# Patient Record
Sex: Female | Born: 2000 | Race: White | Hispanic: No | Marital: Single | Smoking: Never smoker
Health system: Southern US, Community
[De-identification: ages and names within clinical notes are randomized; demographics above are authoritative.]

## PROBLEM LIST (undated history)

## (undated) DIAGNOSIS — S060XAA Concussion with loss of consciousness status unknown, initial encounter: Secondary | ICD-10-CM

## (undated) DIAGNOSIS — T7840XA Allergy, unspecified, initial encounter: Secondary | ICD-10-CM

## (undated) DIAGNOSIS — S060X9A Concussion with loss of consciousness of unspecified duration, initial encounter: Secondary | ICD-10-CM

## (undated) HISTORY — DX: Concussion with loss of consciousness status unknown, initial encounter: S06.0XAA

## (undated) HISTORY — DX: Allergy, unspecified, initial encounter: T78.40XA

## (undated) HISTORY — DX: Concussion with loss of consciousness of unspecified duration, initial encounter: S06.0X9A

---
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
## 2018-02-19 ENCOUNTER — Encounter: Payer: Self-pay | Admitting: General Practice

## 2018-06-05 DIAGNOSIS — J029 Acute pharyngitis, unspecified: Secondary | ICD-10-CM | POA: Diagnosis not present

## 2018-09-29 DIAGNOSIS — F4323 Adjustment disorder with mixed anxiety and depressed mood: Secondary | ICD-10-CM | POA: Diagnosis not present

## 2018-10-02 DIAGNOSIS — F4323 Adjustment disorder with mixed anxiety and depressed mood: Secondary | ICD-10-CM | POA: Diagnosis not present

## 2018-10-03 DIAGNOSIS — F4323 Adjustment disorder with mixed anxiety and depressed mood: Secondary | ICD-10-CM | POA: Diagnosis not present

## 2018-10-13 DIAGNOSIS — J039 Acute tonsillitis, unspecified: Secondary | ICD-10-CM | POA: Diagnosis not present

## 2018-10-13 DIAGNOSIS — B2790 Infectious mononucleosis, unspecified without complication: Secondary | ICD-10-CM | POA: Diagnosis not present

## 2018-11-03 ENCOUNTER — Ambulatory Visit: Payer: 59 | Admitting: Psychology
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
## 2018-12-23 DIAGNOSIS — Z23 Encounter for immunization: Secondary | ICD-10-CM | POA: Diagnosis not present

## 2018-12-23 DIAGNOSIS — Z0000 Encounter for general adult medical examination without abnormal findings: Secondary | ICD-10-CM | POA: Diagnosis not present

## 2019-02-24 DIAGNOSIS — Z23 Encounter for immunization: Secondary | ICD-10-CM | POA: Diagnosis not present

## 2019-03-02 DIAGNOSIS — M25561 Pain in right knee: Secondary | ICD-10-CM | POA: Diagnosis not present

## 2019-03-06 DIAGNOSIS — M25561 Pain in right knee: Secondary | ICD-10-CM | POA: Diagnosis not present

## 2019-03-16 DIAGNOSIS — Z20828 Contact with and (suspected) exposure to other viral communicable diseases: Secondary | ICD-10-CM | POA: Diagnosis not present

## 2019-03-22 DIAGNOSIS — Z3200 Encounter for pregnancy test, result unknown: Secondary | ICD-10-CM | POA: Diagnosis not present

## 2019-03-22 DIAGNOSIS — N83201 Unspecified ovarian cyst, right side: Secondary | ICD-10-CM | POA: Diagnosis not present

## 2019-03-22 DIAGNOSIS — R10813 Right lower quadrant abdominal tenderness: Secondary | ICD-10-CM | POA: Diagnosis not present

## 2019-03-22 DIAGNOSIS — R1031 Right lower quadrant pain: Secondary | ICD-10-CM | POA: Diagnosis not present

## 2019-03-22 DIAGNOSIS — Z3202 Encounter for pregnancy test, result negative: Secondary | ICD-10-CM | POA: Diagnosis not present

## 2019-03-22 DIAGNOSIS — R300 Dysuria: Secondary | ICD-10-CM | POA: Diagnosis not present

## 2019-03-24 DIAGNOSIS — N83209 Unspecified ovarian cyst, unspecified side: Secondary | ICD-10-CM | POA: Diagnosis not present

## 2019-03-24 DIAGNOSIS — R109 Unspecified abdominal pain: Secondary | ICD-10-CM | POA: Diagnosis not present

## 2019-03-27 DIAGNOSIS — N83209 Unspecified ovarian cyst, unspecified side: Secondary | ICD-10-CM | POA: Diagnosis not present

## 2019-04-06 DIAGNOSIS — R1031 Right lower quadrant pain: Secondary | ICD-10-CM | POA: Diagnosis not present

## 2019-04-06 DIAGNOSIS — Z3202 Encounter for pregnancy test, result negative: Secondary | ICD-10-CM | POA: Diagnosis not present

## 2019-04-06 DIAGNOSIS — N83201 Unspecified ovarian cyst, right side: Secondary | ICD-10-CM | POA: Diagnosis not present

## 2019-04-06 DIAGNOSIS — Z975 Presence of (intrauterine) contraceptive device: Secondary | ICD-10-CM | POA: Diagnosis not present

## 2019-04-09 DIAGNOSIS — R102 Pelvic and perineal pain: Secondary | ICD-10-CM | POA: Diagnosis not present

## 2019-04-17 DIAGNOSIS — Z30017 Encounter for initial prescription of implantable subdermal contraceptive: Secondary | ICD-10-CM | POA: Diagnosis not present

## 2019-04-28 DIAGNOSIS — R109 Unspecified abdominal pain: Secondary | ICD-10-CM | POA: Diagnosis not present

## 2019-05-01 DIAGNOSIS — Z3046 Encounter for surveillance of implantable subdermal contraceptive: Secondary | ICD-10-CM | POA: Diagnosis not present

## 2019-05-01 DIAGNOSIS — Z975 Presence of (intrauterine) contraceptive device: Secondary | ICD-10-CM | POA: Diagnosis not present

## 2019-05-12 DIAGNOSIS — L700 Acne vulgaris: Secondary | ICD-10-CM | POA: Diagnosis not present

## 2019-06-01 DIAGNOSIS — Z23 Encounter for immunization: Secondary | ICD-10-CM | POA: Diagnosis not present

## 2019-06-03 DIAGNOSIS — R109 Unspecified abdominal pain: Secondary | ICD-10-CM | POA: Diagnosis not present

## 2019-06-03 DIAGNOSIS — N898 Other specified noninflammatory disorders of vagina: Secondary | ICD-10-CM | POA: Diagnosis not present

## 2019-06-22 DIAGNOSIS — Z975 Presence of (intrauterine) contraceptive device: Secondary | ICD-10-CM | POA: Diagnosis not present

## 2019-06-22 DIAGNOSIS — Z8742 Personal history of other diseases of the female genital tract: Secondary | ICD-10-CM | POA: Diagnosis not present

## 2019-06-22 DIAGNOSIS — B379 Candidiasis, unspecified: Secondary | ICD-10-CM | POA: Diagnosis not present

## 2019-06-22 DIAGNOSIS — N926 Irregular menstruation, unspecified: Secondary | ICD-10-CM | POA: Diagnosis not present

## 2019-06-22 DIAGNOSIS — N83209 Unspecified ovarian cyst, unspecified side: Secondary | ICD-10-CM | POA: Diagnosis not present

## 2019-07-14 DIAGNOSIS — Z79899 Other long term (current) drug therapy: Secondary | ICD-10-CM | POA: Diagnosis not present

## 2019-07-14 DIAGNOSIS — F329 Major depressive disorder, single episode, unspecified: Secondary | ICD-10-CM | POA: Diagnosis not present

## 2019-07-14 DIAGNOSIS — F411 Generalized anxiety disorder: Secondary | ICD-10-CM | POA: Diagnosis not present

## 2019-07-14 DIAGNOSIS — Z658 Other specified problems related to psychosocial circumstances: Secondary | ICD-10-CM | POA: Diagnosis not present

## 2019-08-10 DIAGNOSIS — U071 COVID-19: Secondary | ICD-10-CM | POA: Diagnosis not present

## 2019-08-21 DIAGNOSIS — F411 Generalized anxiety disorder: Secondary | ICD-10-CM | POA: Diagnosis not present

## 2019-08-21 DIAGNOSIS — Z79899 Other long term (current) drug therapy: Secondary | ICD-10-CM | POA: Diagnosis not present

## 2019-08-21 DIAGNOSIS — U071 COVID-19: Secondary | ICD-10-CM | POA: Diagnosis not present

## 2019-08-21 DIAGNOSIS — R0902 Hypoxemia: Secondary | ICD-10-CM | POA: Diagnosis not present

## 2019-09-01 DIAGNOSIS — R05 Cough: Secondary | ICD-10-CM | POA: Diagnosis not present

## 2019-09-01 DIAGNOSIS — F411 Generalized anxiety disorder: Secondary | ICD-10-CM | POA: Diagnosis not present

## 2019-09-01 DIAGNOSIS — U071 COVID-19: Secondary | ICD-10-CM | POA: Diagnosis not present

## 2019-09-08 DIAGNOSIS — R05 Cough: Secondary | ICD-10-CM | POA: Diagnosis not present

## 2019-09-08 DIAGNOSIS — R0602 Shortness of breath: Secondary | ICD-10-CM | POA: Diagnosis not present

## 2019-09-20 DIAGNOSIS — U071 COVID-19: Secondary | ICD-10-CM | POA: Diagnosis not present

## 2019-11-27 DIAGNOSIS — L700 Acne vulgaris: Secondary | ICD-10-CM | POA: Diagnosis not present

## 2019-12-28 DIAGNOSIS — N926 Irregular menstruation, unspecified: Secondary | ICD-10-CM | POA: Diagnosis not present

## 2019-12-28 DIAGNOSIS — F411 Generalized anxiety disorder: Secondary | ICD-10-CM | POA: Diagnosis not present

## 2019-12-28 DIAGNOSIS — Z975 Presence of (intrauterine) contraceptive device: Secondary | ICD-10-CM | POA: Diagnosis not present

## 2019-12-28 DIAGNOSIS — Z0000 Encounter for general adult medical examination without abnormal findings: Secondary | ICD-10-CM | POA: Diagnosis not present

## 2019-12-30 DIAGNOSIS — Z0000 Encounter for general adult medical examination without abnormal findings: Secondary | ICD-10-CM | POA: Diagnosis not present

## 2020-04-15 DIAGNOSIS — Z30433 Encounter for removal and reinsertion of intrauterine contraceptive device: Secondary | ICD-10-CM | POA: Diagnosis not present

## 2020-04-15 DIAGNOSIS — Z3046 Encounter for surveillance of implantable subdermal contraceptive: Secondary | ICD-10-CM | POA: Diagnosis not present

## 2020-04-15 DIAGNOSIS — Z124 Encounter for screening for malignant neoplasm of cervix: Secondary | ICD-10-CM | POA: Diagnosis not present

## 2020-04-15 DIAGNOSIS — N926 Irregular menstruation, unspecified: Secondary | ICD-10-CM | POA: Diagnosis not present

## 2020-04-15 DIAGNOSIS — Z975 Presence of (intrauterine) contraceptive device: Secondary | ICD-10-CM | POA: Diagnosis not present

## 2020-04-15 DIAGNOSIS — Z30430 Encounter for insertion of intrauterine contraceptive device: Secondary | ICD-10-CM | POA: Diagnosis not present

## 2020-04-19 DIAGNOSIS — N920 Excessive and frequent menstruation with regular cycle: Secondary | ICD-10-CM | POA: Diagnosis not present

## 2020-05-13 DIAGNOSIS — Z30431 Encounter for routine checking of intrauterine contraceptive device: Secondary | ICD-10-CM | POA: Diagnosis not present

## 2020-05-13 DIAGNOSIS — Z975 Presence of (intrauterine) contraceptive device: Secondary | ICD-10-CM | POA: Diagnosis not present

## 2020-05-13 DIAGNOSIS — Z01419 Encounter for gynecological examination (general) (routine) without abnormal findings: Secondary | ICD-10-CM | POA: Diagnosis not present

## 2020-06-08 DIAGNOSIS — Z23 Encounter for immunization: Secondary | ICD-10-CM | POA: Diagnosis not present

## 2020-07-06 DIAGNOSIS — Z23 Encounter for immunization: Secondary | ICD-10-CM | POA: Diagnosis not present

## 2020-08-03 DIAGNOSIS — Z20822 Contact with and (suspected) exposure to covid-19: Secondary | ICD-10-CM | POA: Diagnosis not present

## 2020-08-22 DIAGNOSIS — H04123 Dry eye syndrome of bilateral lacrimal glands: Secondary | ICD-10-CM | POA: Diagnosis not present

## 2020-10-04 DIAGNOSIS — G4700 Insomnia, unspecified: Secondary | ICD-10-CM | POA: Diagnosis not present

## 2020-10-04 DIAGNOSIS — F419 Anxiety disorder, unspecified: Secondary | ICD-10-CM | POA: Diagnosis not present

## 2020-10-04 DIAGNOSIS — J3081 Allergic rhinitis due to animal (cat) (dog) hair and dander: Secondary | ICD-10-CM | POA: Diagnosis not present

## 2020-10-04 DIAGNOSIS — F439 Reaction to severe stress, unspecified: Secondary | ICD-10-CM | POA: Diagnosis not present

## 2020-10-04 DIAGNOSIS — R062 Wheezing: Secondary | ICD-10-CM | POA: Diagnosis not present

## 2020-11-09 DIAGNOSIS — R1030 Lower abdominal pain, unspecified: Secondary | ICD-10-CM | POA: Diagnosis not present

## 2020-11-09 DIAGNOSIS — N888 Other specified noninflammatory disorders of cervix uteri: Secondary | ICD-10-CM | POA: Diagnosis not present

## 2020-11-09 DIAGNOSIS — R109 Unspecified abdominal pain: Secondary | ICD-10-CM | POA: Diagnosis not present

## 2020-11-09 DIAGNOSIS — R300 Dysuria: Secondary | ICD-10-CM | POA: Diagnosis not present

## 2020-11-10 DIAGNOSIS — N888 Other specified noninflammatory disorders of cervix uteri: Secondary | ICD-10-CM | POA: Diagnosis not present

## 2020-11-18 DIAGNOSIS — H00019 Hordeolum externum unspecified eye, unspecified eyelid: Secondary | ICD-10-CM | POA: Diagnosis not present

## 2020-11-18 DIAGNOSIS — F411 Generalized anxiety disorder: Secondary | ICD-10-CM | POA: Diagnosis not present

## 2020-12-13 DIAGNOSIS — D492 Neoplasm of unspecified behavior of bone, soft tissue, and skin: Secondary | ICD-10-CM | POA: Diagnosis not present

## 2020-12-22 DIAGNOSIS — Z23 Encounter for immunization: Secondary | ICD-10-CM | POA: Diagnosis not present

## 2020-12-29 DIAGNOSIS — F411 Generalized anxiety disorder: Secondary | ICD-10-CM | POA: Diagnosis not present

## 2020-12-29 DIAGNOSIS — Z0000 Encounter for general adult medical examination without abnormal findings: Secondary | ICD-10-CM | POA: Diagnosis not present

## 2020-12-29 DIAGNOSIS — Z975 Presence of (intrauterine) contraceptive device: Secondary | ICD-10-CM | POA: Diagnosis not present

## 2020-12-29 DIAGNOSIS — R5383 Other fatigue: Secondary | ICD-10-CM | POA: Diagnosis not present

## 2020-12-30 DIAGNOSIS — Z0000 Encounter for general adult medical examination without abnormal findings: Secondary | ICD-10-CM | POA: Diagnosis not present

## 2020-12-30 DIAGNOSIS — R5383 Other fatigue: Secondary | ICD-10-CM | POA: Diagnosis not present

## 2021-03-01 DIAGNOSIS — U071 COVID-19: Secondary | ICD-10-CM | POA: Diagnosis not present

## 2021-04-13 DIAGNOSIS — Z5181 Encounter for therapeutic drug level monitoring: Secondary | ICD-10-CM | POA: Diagnosis not present

## 2021-04-13 DIAGNOSIS — L700 Acne vulgaris: Secondary | ICD-10-CM | POA: Diagnosis not present

## 2021-04-14 DIAGNOSIS — Z5181 Encounter for therapeutic drug level monitoring: Secondary | ICD-10-CM | POA: Diagnosis not present

## 2021-04-14 DIAGNOSIS — Z79899 Other long term (current) drug therapy: Secondary | ICD-10-CM | POA: Diagnosis not present

## 2021-04-27 DIAGNOSIS — Z832 Family history of diseases of the blood and blood-forming organs and certain disorders involving the immune mechanism: Secondary | ICD-10-CM | POA: Diagnosis not present

## 2021-05-08 DIAGNOSIS — S46911A Strain of unspecified muscle, fascia and tendon at shoulder and upper arm level, right arm, initial encounter: Secondary | ICD-10-CM | POA: Diagnosis not present

## 2021-05-11 DIAGNOSIS — S46911A Strain of unspecified muscle, fascia and tendon at shoulder and upper arm level, right arm, initial encounter: Secondary | ICD-10-CM | POA: Diagnosis not present

## 2021-05-12 DIAGNOSIS — M25511 Pain in right shoulder: Secondary | ICD-10-CM | POA: Diagnosis not present

## 2021-05-16 DIAGNOSIS — M9902 Segmental and somatic dysfunction of thoracic region: Secondary | ICD-10-CM | POA: Diagnosis not present

## 2021-05-16 DIAGNOSIS — M9901 Segmental and somatic dysfunction of cervical region: Secondary | ICD-10-CM | POA: Diagnosis not present

## 2021-05-16 DIAGNOSIS — M25511 Pain in right shoulder: Secondary | ICD-10-CM | POA: Diagnosis not present

## 2021-05-17 DIAGNOSIS — Z5181 Encounter for therapeutic drug level monitoring: Secondary | ICD-10-CM | POA: Diagnosis not present

## 2021-05-17 DIAGNOSIS — L700 Acne vulgaris: Secondary | ICD-10-CM | POA: Diagnosis not present

## 2021-05-23 DIAGNOSIS — M9902 Segmental and somatic dysfunction of thoracic region: Secondary | ICD-10-CM | POA: Diagnosis not present

## 2021-05-23 DIAGNOSIS — M25511 Pain in right shoulder: Secondary | ICD-10-CM | POA: Diagnosis not present

## 2021-05-23 DIAGNOSIS — M9901 Segmental and somatic dysfunction of cervical region: Secondary | ICD-10-CM | POA: Diagnosis not present

## 2021-05-30 DIAGNOSIS — M9901 Segmental and somatic dysfunction of cervical region: Secondary | ICD-10-CM | POA: Diagnosis not present

## 2021-05-30 DIAGNOSIS — M9902 Segmental and somatic dysfunction of thoracic region: Secondary | ICD-10-CM | POA: Diagnosis not present

## 2021-05-30 DIAGNOSIS — M25511 Pain in right shoulder: Secondary | ICD-10-CM | POA: Diagnosis not present

## 2021-06-06 DIAGNOSIS — M9901 Segmental and somatic dysfunction of cervical region: Secondary | ICD-10-CM | POA: Diagnosis not present

## 2021-06-06 DIAGNOSIS — M25511 Pain in right shoulder: Secondary | ICD-10-CM | POA: Diagnosis not present

## 2021-06-06 DIAGNOSIS — M9902 Segmental and somatic dysfunction of thoracic region: Secondary | ICD-10-CM | POA: Diagnosis not present

## 2021-06-07 DIAGNOSIS — N898 Other specified noninflammatory disorders of vagina: Secondary | ICD-10-CM | POA: Diagnosis not present

## 2021-06-19 DIAGNOSIS — R040 Epistaxis: Secondary | ICD-10-CM | POA: Diagnosis not present

## 2021-06-19 DIAGNOSIS — L700 Acne vulgaris: Secondary | ICD-10-CM | POA: Diagnosis not present

## 2021-06-19 DIAGNOSIS — M9901 Segmental and somatic dysfunction of cervical region: Secondary | ICD-10-CM | POA: Diagnosis not present

## 2021-06-19 DIAGNOSIS — M25511 Pain in right shoulder: Secondary | ICD-10-CM | POA: Diagnosis not present

## 2021-06-19 DIAGNOSIS — M9902 Segmental and somatic dysfunction of thoracic region: Secondary | ICD-10-CM | POA: Diagnosis not present

## 2021-06-19 DIAGNOSIS — K130 Diseases of lips: Secondary | ICD-10-CM | POA: Diagnosis not present

## 2021-06-30 DIAGNOSIS — M25511 Pain in right shoulder: Secondary | ICD-10-CM | POA: Diagnosis not present

## 2021-06-30 DIAGNOSIS — M9901 Segmental and somatic dysfunction of cervical region: Secondary | ICD-10-CM | POA: Diagnosis not present

## 2021-06-30 DIAGNOSIS — M9902 Segmental and somatic dysfunction of thoracic region: Secondary | ICD-10-CM | POA: Diagnosis not present

## 2021-07-10 DIAGNOSIS — S46911A Strain of unspecified muscle, fascia and tendon at shoulder and upper arm level, right arm, initial encounter: Secondary | ICD-10-CM | POA: Diagnosis not present

## 2021-07-10 DIAGNOSIS — J029 Acute pharyngitis, unspecified: Secondary | ICD-10-CM | POA: Diagnosis not present

## 2021-07-17 DIAGNOSIS — L853 Xerosis cutis: Secondary | ICD-10-CM | POA: Diagnosis not present

## 2021-07-17 DIAGNOSIS — L700 Acne vulgaris: Secondary | ICD-10-CM | POA: Diagnosis not present

## 2021-07-17 DIAGNOSIS — K130 Diseases of lips: Secondary | ICD-10-CM | POA: Diagnosis not present

## 2021-07-17 DIAGNOSIS — Z5181 Encounter for therapeutic drug level monitoring: Secondary | ICD-10-CM | POA: Diagnosis not present

## 2021-07-21 DIAGNOSIS — H16223 Keratoconjunctivitis sicca, not specified as Sjogren's, bilateral: Secondary | ICD-10-CM | POA: Diagnosis not present

## 2021-07-24 DIAGNOSIS — M25561 Pain in right knee: Secondary | ICD-10-CM | POA: Diagnosis not present

## 2021-07-24 DIAGNOSIS — M25552 Pain in left hip: Secondary | ICD-10-CM | POA: Diagnosis not present

## 2021-08-01 DIAGNOSIS — M9901 Segmental and somatic dysfunction of cervical region: Secondary | ICD-10-CM | POA: Diagnosis not present

## 2021-08-01 DIAGNOSIS — M25561 Pain in right knee: Secondary | ICD-10-CM | POA: Diagnosis not present

## 2021-08-01 DIAGNOSIS — M9902 Segmental and somatic dysfunction of thoracic region: Secondary | ICD-10-CM | POA: Diagnosis not present

## 2021-08-07 DIAGNOSIS — M25552 Pain in left hip: Secondary | ICD-10-CM | POA: Diagnosis not present

## 2021-08-07 DIAGNOSIS — M25561 Pain in right knee: Secondary | ICD-10-CM | POA: Diagnosis not present

## 2021-08-14 DIAGNOSIS — K130 Diseases of lips: Secondary | ICD-10-CM | POA: Diagnosis not present

## 2021-08-14 DIAGNOSIS — L853 Xerosis cutis: Secondary | ICD-10-CM | POA: Diagnosis not present

## 2021-08-14 DIAGNOSIS — L700 Acne vulgaris: Secondary | ICD-10-CM | POA: Diagnosis not present

## 2021-08-14 DIAGNOSIS — Z5181 Encounter for therapeutic drug level monitoring: Secondary | ICD-10-CM | POA: Diagnosis not present

## 2021-08-16 DIAGNOSIS — M25561 Pain in right knee: Secondary | ICD-10-CM | POA: Diagnosis not present

## 2021-08-16 DIAGNOSIS — M9902 Segmental and somatic dysfunction of thoracic region: Secondary | ICD-10-CM | POA: Diagnosis not present

## 2021-08-16 DIAGNOSIS — M9901 Segmental and somatic dysfunction of cervical region: Secondary | ICD-10-CM | POA: Diagnosis not present

## 2021-08-22 DIAGNOSIS — M9902 Segmental and somatic dysfunction of thoracic region: Secondary | ICD-10-CM | POA: Diagnosis not present

## 2021-08-22 DIAGNOSIS — M9903 Segmental and somatic dysfunction of lumbar region: Secondary | ICD-10-CM | POA: Diagnosis not present

## 2021-08-22 DIAGNOSIS — M9904 Segmental and somatic dysfunction of sacral region: Secondary | ICD-10-CM | POA: Diagnosis not present

## 2021-08-22 DIAGNOSIS — M5451 Vertebrogenic low back pain: Secondary | ICD-10-CM | POA: Diagnosis not present

## 2021-08-29 DIAGNOSIS — M9901 Segmental and somatic dysfunction of cervical region: Secondary | ICD-10-CM | POA: Diagnosis not present

## 2021-08-29 DIAGNOSIS — M25561 Pain in right knee: Secondary | ICD-10-CM | POA: Diagnosis not present

## 2021-08-29 DIAGNOSIS — M9902 Segmental and somatic dysfunction of thoracic region: Secondary | ICD-10-CM | POA: Diagnosis not present

## 2021-08-31 DIAGNOSIS — Z975 Presence of (intrauterine) contraceptive device: Secondary | ICD-10-CM | POA: Diagnosis not present

## 2021-08-31 DIAGNOSIS — Z01419 Encounter for gynecological examination (general) (routine) without abnormal findings: Secondary | ICD-10-CM | POA: Diagnosis not present

## 2021-09-05 DIAGNOSIS — M5451 Vertebrogenic low back pain: Secondary | ICD-10-CM | POA: Diagnosis not present

## 2021-09-05 DIAGNOSIS — M9902 Segmental and somatic dysfunction of thoracic region: Secondary | ICD-10-CM | POA: Diagnosis not present

## 2021-09-05 DIAGNOSIS — M9904 Segmental and somatic dysfunction of sacral region: Secondary | ICD-10-CM | POA: Diagnosis not present

## 2021-09-05 DIAGNOSIS — M9903 Segmental and somatic dysfunction of lumbar region: Secondary | ICD-10-CM | POA: Diagnosis not present

## 2021-09-08 DIAGNOSIS — F410 Panic disorder [episodic paroxysmal anxiety] without agoraphobia: Secondary | ICD-10-CM | POA: Diagnosis not present

## 2021-09-08 DIAGNOSIS — F409 Phobic anxiety disorder, unspecified: Secondary | ICD-10-CM | POA: Diagnosis not present

## 2021-09-11 DIAGNOSIS — L700 Acne vulgaris: Secondary | ICD-10-CM | POA: Diagnosis not present

## 2021-09-11 DIAGNOSIS — Z5181 Encounter for therapeutic drug level monitoring: Secondary | ICD-10-CM | POA: Diagnosis not present

## 2021-09-11 DIAGNOSIS — M791 Myalgia, unspecified site: Secondary | ICD-10-CM | POA: Diagnosis not present

## 2021-09-11 DIAGNOSIS — Z79899 Other long term (current) drug therapy: Secondary | ICD-10-CM | POA: Diagnosis not present

## 2021-09-11 DIAGNOSIS — L853 Xerosis cutis: Secondary | ICD-10-CM | POA: Diagnosis not present

## 2021-09-18 DIAGNOSIS — N898 Other specified noninflammatory disorders of vagina: Secondary | ICD-10-CM | POA: Diagnosis not present

## 2021-09-18 DIAGNOSIS — N949 Unspecified condition associated with female genital organs and menstrual cycle: Secondary | ICD-10-CM | POA: Diagnosis not present

## 2021-09-18 DIAGNOSIS — M9902 Segmental and somatic dysfunction of thoracic region: Secondary | ICD-10-CM | POA: Diagnosis not present

## 2021-09-18 DIAGNOSIS — M25561 Pain in right knee: Secondary | ICD-10-CM | POA: Diagnosis not present

## 2021-09-18 DIAGNOSIS — M9901 Segmental and somatic dysfunction of cervical region: Secondary | ICD-10-CM | POA: Diagnosis not present

## 2021-10-09 DIAGNOSIS — M5451 Vertebrogenic low back pain: Secondary | ICD-10-CM | POA: Diagnosis not present

## 2021-10-09 DIAGNOSIS — M9904 Segmental and somatic dysfunction of sacral region: Secondary | ICD-10-CM | POA: Diagnosis not present

## 2021-10-09 DIAGNOSIS — M9903 Segmental and somatic dysfunction of lumbar region: Secondary | ICD-10-CM | POA: Diagnosis not present

## 2021-10-09 DIAGNOSIS — M9902 Segmental and somatic dysfunction of thoracic region: Secondary | ICD-10-CM | POA: Diagnosis not present

## 2021-10-16 DIAGNOSIS — M9901 Segmental and somatic dysfunction of cervical region: Secondary | ICD-10-CM | POA: Diagnosis not present

## 2021-10-16 DIAGNOSIS — M25561 Pain in right knee: Secondary | ICD-10-CM | POA: Diagnosis not present

## 2021-10-16 DIAGNOSIS — M9902 Segmental and somatic dysfunction of thoracic region: Secondary | ICD-10-CM | POA: Diagnosis not present

## 2021-11-08 DIAGNOSIS — S99921A Unspecified injury of right foot, initial encounter: Secondary | ICD-10-CM | POA: Diagnosis not present

## 2021-11-13 DIAGNOSIS — M9903 Segmental and somatic dysfunction of lumbar region: Secondary | ICD-10-CM | POA: Diagnosis not present

## 2021-11-13 DIAGNOSIS — M5451 Vertebrogenic low back pain: Secondary | ICD-10-CM | POA: Diagnosis not present

## 2021-11-13 DIAGNOSIS — M9904 Segmental and somatic dysfunction of sacral region: Secondary | ICD-10-CM | POA: Diagnosis not present

## 2021-11-13 DIAGNOSIS — M9902 Segmental and somatic dysfunction of thoracic region: Secondary | ICD-10-CM | POA: Diagnosis not present

## 2021-11-15 DIAGNOSIS — L700 Acne vulgaris: Secondary | ICD-10-CM | POA: Diagnosis not present

## 2021-11-15 DIAGNOSIS — Z5181 Encounter for therapeutic drug level monitoring: Secondary | ICD-10-CM | POA: Diagnosis not present

## 2021-12-14 DIAGNOSIS — L700 Acne vulgaris: Secondary | ICD-10-CM | POA: Diagnosis not present

## 2021-12-14 DIAGNOSIS — Z5181 Encounter for therapeutic drug level monitoring: Secondary | ICD-10-CM | POA: Diagnosis not present

## 2021-12-18 DIAGNOSIS — M5451 Vertebrogenic low back pain: Secondary | ICD-10-CM | POA: Diagnosis not present

## 2021-12-18 DIAGNOSIS — M9902 Segmental and somatic dysfunction of thoracic region: Secondary | ICD-10-CM | POA: Diagnosis not present

## 2021-12-18 DIAGNOSIS — M9903 Segmental and somatic dysfunction of lumbar region: Secondary | ICD-10-CM | POA: Diagnosis not present

## 2021-12-18 DIAGNOSIS — M9904 Segmental and somatic dysfunction of sacral region: Secondary | ICD-10-CM | POA: Diagnosis not present

## 2022-01-08 DIAGNOSIS — M9904 Segmental and somatic dysfunction of sacral region: Secondary | ICD-10-CM | POA: Diagnosis not present

## 2022-01-08 DIAGNOSIS — M5451 Vertebrogenic low back pain: Secondary | ICD-10-CM | POA: Diagnosis not present

## 2022-01-08 DIAGNOSIS — M9903 Segmental and somatic dysfunction of lumbar region: Secondary | ICD-10-CM | POA: Diagnosis not present

## 2022-01-08 DIAGNOSIS — M9902 Segmental and somatic dysfunction of thoracic region: Secondary | ICD-10-CM | POA: Diagnosis not present

## 2022-01-29 DIAGNOSIS — M5451 Vertebrogenic low back pain: Secondary | ICD-10-CM | POA: Diagnosis not present

## 2022-01-29 DIAGNOSIS — M9902 Segmental and somatic dysfunction of thoracic region: Secondary | ICD-10-CM | POA: Diagnosis not present

## 2022-01-29 DIAGNOSIS — M9903 Segmental and somatic dysfunction of lumbar region: Secondary | ICD-10-CM | POA: Diagnosis not present

## 2022-01-29 DIAGNOSIS — M9904 Segmental and somatic dysfunction of sacral region: Secondary | ICD-10-CM | POA: Diagnosis not present

## 2022-02-06 DIAGNOSIS — F411 Generalized anxiety disorder: Secondary | ICD-10-CM | POA: Diagnosis not present

## 2022-02-06 DIAGNOSIS — Z0000 Encounter for general adult medical examination without abnormal findings: Secondary | ICD-10-CM | POA: Diagnosis not present

## 2022-02-06 DIAGNOSIS — E663 Overweight: Secondary | ICD-10-CM | POA: Diagnosis not present

## 2022-03-05 DIAGNOSIS — M9903 Segmental and somatic dysfunction of lumbar region: Secondary | ICD-10-CM | POA: Diagnosis not present

## 2022-03-05 DIAGNOSIS — M9904 Segmental and somatic dysfunction of sacral region: Secondary | ICD-10-CM | POA: Diagnosis not present

## 2022-03-05 DIAGNOSIS — M5451 Vertebrogenic low back pain: Secondary | ICD-10-CM | POA: Diagnosis not present

## 2022-03-05 DIAGNOSIS — M9902 Segmental and somatic dysfunction of thoracic region: Secondary | ICD-10-CM | POA: Diagnosis not present

## 2022-04-09 DIAGNOSIS — M9904 Segmental and somatic dysfunction of sacral region: Secondary | ICD-10-CM | POA: Diagnosis not present

## 2022-04-09 DIAGNOSIS — M9903 Segmental and somatic dysfunction of lumbar region: Secondary | ICD-10-CM | POA: Diagnosis not present

## 2022-04-09 DIAGNOSIS — M5451 Vertebrogenic low back pain: Secondary | ICD-10-CM | POA: Diagnosis not present

## 2022-04-09 DIAGNOSIS — M9902 Segmental and somatic dysfunction of thoracic region: Secondary | ICD-10-CM | POA: Diagnosis not present

## 2022-05-15 DIAGNOSIS — M25552 Pain in left hip: Secondary | ICD-10-CM | POA: Diagnosis not present

## 2022-05-15 DIAGNOSIS — M545 Low back pain, unspecified: Secondary | ICD-10-CM | POA: Diagnosis not present

## 2022-05-24 DIAGNOSIS — M545 Low back pain, unspecified: Secondary | ICD-10-CM | POA: Diagnosis not present

## 2022-05-24 DIAGNOSIS — M25552 Pain in left hip: Secondary | ICD-10-CM | POA: Diagnosis not present

## 2022-06-07 DIAGNOSIS — R059 Cough, unspecified: Secondary | ICD-10-CM | POA: Diagnosis not present

## 2022-06-07 DIAGNOSIS — R509 Fever, unspecified: Secondary | ICD-10-CM | POA: Diagnosis not present

## 2022-06-07 DIAGNOSIS — R079 Chest pain, unspecified: Secondary | ICD-10-CM | POA: Diagnosis not present

## 2022-06-08 DIAGNOSIS — R1032 Left lower quadrant pain: Secondary | ICD-10-CM | POA: Diagnosis not present

## 2022-06-08 DIAGNOSIS — R1031 Right lower quadrant pain: Secondary | ICD-10-CM | POA: Diagnosis not present

## 2022-06-08 DIAGNOSIS — Z8616 Personal history of COVID-19: Secondary | ICD-10-CM | POA: Diagnosis not present

## 2022-06-08 DIAGNOSIS — R079 Chest pain, unspecified: Secondary | ICD-10-CM | POA: Diagnosis not present

## 2022-06-08 DIAGNOSIS — R509 Fever, unspecified: Secondary | ICD-10-CM | POA: Diagnosis not present

## 2022-06-08 DIAGNOSIS — R091 Pleurisy: Secondary | ICD-10-CM | POA: Diagnosis not present

## 2022-06-08 DIAGNOSIS — R059 Cough, unspecified: Secondary | ICD-10-CM | POA: Diagnosis not present

## 2022-06-14 DIAGNOSIS — M545 Low back pain, unspecified: Secondary | ICD-10-CM | POA: Diagnosis not present

## 2022-06-14 DIAGNOSIS — M25552 Pain in left hip: Secondary | ICD-10-CM | POA: Diagnosis not present

## 2022-06-21 DIAGNOSIS — M545 Low back pain, unspecified: Secondary | ICD-10-CM | POA: Diagnosis not present

## 2022-06-21 DIAGNOSIS — M25552 Pain in left hip: Secondary | ICD-10-CM | POA: Diagnosis not present

## 2022-07-12 DIAGNOSIS — M545 Low back pain, unspecified: Secondary | ICD-10-CM | POA: Diagnosis not present

## 2022-07-12 DIAGNOSIS — M25552 Pain in left hip: Secondary | ICD-10-CM | POA: Diagnosis not present

## 2022-08-16 DIAGNOSIS — M25552 Pain in left hip: Secondary | ICD-10-CM | POA: Diagnosis not present

## 2022-08-16 DIAGNOSIS — M545 Low back pain, unspecified: Secondary | ICD-10-CM | POA: Diagnosis not present

## 2023-01-02 DIAGNOSIS — G8929 Other chronic pain: Secondary | ICD-10-CM | POA: Diagnosis not present

## 2023-01-02 DIAGNOSIS — Z6829 Body mass index (BMI) 29.0-29.9, adult: Secondary | ICD-10-CM | POA: Diagnosis not present

## 2023-01-02 DIAGNOSIS — Z7689 Persons encountering health services in other specified circumstances: Secondary | ICD-10-CM | POA: Diagnosis not present

## 2023-01-02 DIAGNOSIS — M25552 Pain in left hip: Secondary | ICD-10-CM | POA: Diagnosis not present

## 2023-01-14 DIAGNOSIS — M25552 Pain in left hip: Secondary | ICD-10-CM | POA: Diagnosis not present

## 2023-01-14 DIAGNOSIS — G8929 Other chronic pain: Secondary | ICD-10-CM | POA: Diagnosis not present

## 2023-01-14 DIAGNOSIS — Z6829 Body mass index (BMI) 29.0-29.9, adult: Secondary | ICD-10-CM | POA: Diagnosis not present

## 2023-02-01 DIAGNOSIS — G8929 Other chronic pain: Secondary | ICD-10-CM | POA: Diagnosis not present

## 2023-02-01 DIAGNOSIS — M25552 Pain in left hip: Secondary | ICD-10-CM | POA: Diagnosis not present

## 2023-02-01 DIAGNOSIS — M25852 Other specified joint disorders, left hip: Secondary | ICD-10-CM | POA: Diagnosis not present

## 2023-02-18 DIAGNOSIS — Z6829 Body mass index (BMI) 29.0-29.9, adult: Secondary | ICD-10-CM | POA: Diagnosis not present

## 2023-02-18 DIAGNOSIS — M25852 Other specified joint disorders, left hip: Secondary | ICD-10-CM | POA: Diagnosis not present

## 2023-03-02 DIAGNOSIS — M25852 Other specified joint disorders, left hip: Secondary | ICD-10-CM | POA: Diagnosis not present

## 2023-03-02 DIAGNOSIS — M25552 Pain in left hip: Secondary | ICD-10-CM | POA: Diagnosis not present

## 2023-03-12 DIAGNOSIS — M25552 Pain in left hip: Secondary | ICD-10-CM | POA: Diagnosis not present

## 2023-03-12 DIAGNOSIS — M25852 Other specified joint disorders, left hip: Secondary | ICD-10-CM | POA: Diagnosis not present

## 2023-04-10 DIAGNOSIS — M25552 Pain in left hip: Secondary | ICD-10-CM | POA: Diagnosis not present

## 2023-04-10 DIAGNOSIS — M25852 Other specified joint disorders, left hip: Secondary | ICD-10-CM | POA: Diagnosis not present

## 2023-04-23 DIAGNOSIS — M25552 Pain in left hip: Secondary | ICD-10-CM | POA: Diagnosis not present

## 2023-04-23 DIAGNOSIS — M25852 Other specified joint disorders, left hip: Secondary | ICD-10-CM | POA: Diagnosis not present

## 2023-05-15 DIAGNOSIS — M25552 Pain in left hip: Secondary | ICD-10-CM | POA: Diagnosis not present

## 2023-05-15 DIAGNOSIS — M25852 Other specified joint disorders, left hip: Secondary | ICD-10-CM | POA: Diagnosis not present

## 2023-06-21 DIAGNOSIS — Z6828 Body mass index (BMI) 28.0-28.9, adult: Secondary | ICD-10-CM | POA: Diagnosis not present

## 2023-06-21 DIAGNOSIS — R238 Other skin changes: Secondary | ICD-10-CM | POA: Diagnosis not present

## 2023-06-21 DIAGNOSIS — M25531 Pain in right wrist: Secondary | ICD-10-CM | POA: Diagnosis not present

## 2023-07-08 DIAGNOSIS — S99922A Unspecified injury of left foot, initial encounter: Secondary | ICD-10-CM | POA: Diagnosis not present

## 2023-07-08 DIAGNOSIS — Z6827 Body mass index (BMI) 27.0-27.9, adult: Secondary | ICD-10-CM | POA: Diagnosis not present

## 2023-07-08 DIAGNOSIS — S96912A Strain of unspecified muscle and tendon at ankle and foot level, left foot, initial encounter: Secondary | ICD-10-CM | POA: Diagnosis not present

## 2023-07-16 DIAGNOSIS — Z6827 Body mass index (BMI) 27.0-27.9, adult: Secondary | ICD-10-CM | POA: Diagnosis not present

## 2023-07-16 DIAGNOSIS — S93522A Sprain of metatarsophalangeal joint of left great toe, initial encounter: Secondary | ICD-10-CM | POA: Diagnosis not present

## 2023-07-24 DIAGNOSIS — Z6827 Body mass index (BMI) 27.0-27.9, adult: Secondary | ICD-10-CM | POA: Diagnosis not present

## 2023-07-24 DIAGNOSIS — M25852 Other specified joint disorders, left hip: Secondary | ICD-10-CM | POA: Diagnosis not present

## 2023-09-20 DIAGNOSIS — L218 Other seborrheic dermatitis: Secondary | ICD-10-CM | POA: Diagnosis not present

## 2023-09-20 DIAGNOSIS — L700 Acne vulgaris: Secondary | ICD-10-CM | POA: Diagnosis not present

## 2023-09-20 DIAGNOSIS — C44319 Basal cell carcinoma of skin of other parts of face: Secondary | ICD-10-CM | POA: Diagnosis not present

## 2023-10-15 DIAGNOSIS — C44319 Basal cell carcinoma of skin of other parts of face: Secondary | ICD-10-CM | POA: Diagnosis not present

## 2023-10-18 DIAGNOSIS — M25852 Other specified joint disorders, left hip: Secondary | ICD-10-CM | POA: Diagnosis not present

## 2023-10-18 DIAGNOSIS — S73192A Other sprain of left hip, initial encounter: Secondary | ICD-10-CM | POA: Diagnosis not present

## 2023-11-05 DIAGNOSIS — Z9889 Other specified postprocedural states: Secondary | ICD-10-CM | POA: Diagnosis not present

## 2023-11-05 DIAGNOSIS — M25652 Stiffness of left hip, not elsewhere classified: Secondary | ICD-10-CM | POA: Diagnosis not present

## 2023-11-15 DIAGNOSIS — M25852 Other specified joint disorders, left hip: Secondary | ICD-10-CM | POA: Diagnosis not present

## 2023-11-20 DIAGNOSIS — Z9889 Other specified postprocedural states: Secondary | ICD-10-CM | POA: Diagnosis not present

## 2023-11-20 DIAGNOSIS — M25652 Stiffness of left hip, not elsewhere classified: Secondary | ICD-10-CM | POA: Diagnosis not present

## 2023-11-27 DIAGNOSIS — M25652 Stiffness of left hip, not elsewhere classified: Secondary | ICD-10-CM | POA: Diagnosis not present

## 2023-11-27 DIAGNOSIS — Z9889 Other specified postprocedural states: Secondary | ICD-10-CM | POA: Diagnosis not present

## 2023-12-05 DIAGNOSIS — Z9889 Other specified postprocedural states: Secondary | ICD-10-CM | POA: Diagnosis not present

## 2023-12-05 DIAGNOSIS — M25552 Pain in left hip: Secondary | ICD-10-CM | POA: Diagnosis not present

## 2023-12-18 DIAGNOSIS — Z9889 Other specified postprocedural states: Secondary | ICD-10-CM | POA: Diagnosis not present

## 2023-12-18 DIAGNOSIS — M25552 Pain in left hip: Secondary | ICD-10-CM | POA: Diagnosis not present

## 2023-12-20 DIAGNOSIS — R399 Unspecified symptoms and signs involving the genitourinary system: Secondary | ICD-10-CM | POA: Diagnosis not present

## 2023-12-20 DIAGNOSIS — Z6829 Body mass index (BMI) 29.0-29.9, adult: Secondary | ICD-10-CM | POA: Diagnosis not present

## 2023-12-20 DIAGNOSIS — N10 Acute tubulo-interstitial nephritis: Secondary | ICD-10-CM | POA: Diagnosis not present

## 2023-12-25 DIAGNOSIS — Z9889 Other specified postprocedural states: Secondary | ICD-10-CM | POA: Diagnosis not present

## 2023-12-25 DIAGNOSIS — M25552 Pain in left hip: Secondary | ICD-10-CM | POA: Diagnosis not present

## 2024-01-09 DIAGNOSIS — Z96642 Presence of left artificial hip joint: Secondary | ICD-10-CM | POA: Diagnosis not present

## 2024-01-09 DIAGNOSIS — M25552 Pain in left hip: Secondary | ICD-10-CM | POA: Diagnosis not present

## 2024-01-09 DIAGNOSIS — R29898 Other symptoms and signs involving the musculoskeletal system: Secondary | ICD-10-CM | POA: Diagnosis not present

## 2024-01-10 DIAGNOSIS — Z1331 Encounter for screening for depression: Secondary | ICD-10-CM | POA: Diagnosis not present

## 2024-01-10 DIAGNOSIS — Z6829 Body mass index (BMI) 29.0-29.9, adult: Secondary | ICD-10-CM | POA: Diagnosis not present

## 2024-01-10 DIAGNOSIS — R6889 Other general symptoms and signs: Secondary | ICD-10-CM | POA: Diagnosis not present

## 2024-01-23 DIAGNOSIS — M25552 Pain in left hip: Secondary | ICD-10-CM | POA: Diagnosis not present

## 2024-01-23 DIAGNOSIS — Z96642 Presence of left artificial hip joint: Secondary | ICD-10-CM | POA: Diagnosis not present

## 2024-01-23 DIAGNOSIS — R29898 Other symptoms and signs involving the musculoskeletal system: Secondary | ICD-10-CM | POA: Diagnosis not present

## 2024-02-05 DIAGNOSIS — M25552 Pain in left hip: Secondary | ICD-10-CM | POA: Diagnosis not present

## 2024-02-05 DIAGNOSIS — M25551 Pain in right hip: Secondary | ICD-10-CM | POA: Diagnosis not present

## 2024-02-05 DIAGNOSIS — R2689 Other abnormalities of gait and mobility: Secondary | ICD-10-CM | POA: Diagnosis not present

## 2024-02-13 DIAGNOSIS — J343 Hypertrophy of nasal turbinates: Secondary | ICD-10-CM | POA: Diagnosis not present

## 2024-02-13 DIAGNOSIS — J309 Allergic rhinitis, unspecified: Secondary | ICD-10-CM | POA: Diagnosis not present

## 2024-02-13 DIAGNOSIS — J3489 Other specified disorders of nose and nasal sinuses: Secondary | ICD-10-CM | POA: Diagnosis not present

## 2024-02-13 DIAGNOSIS — J34829 Nasal valve collapse, unspecified: Secondary | ICD-10-CM | POA: Diagnosis not present

## 2024-02-18 DIAGNOSIS — M25551 Pain in right hip: Secondary | ICD-10-CM | POA: Diagnosis not present

## 2024-02-20 DIAGNOSIS — R2689 Other abnormalities of gait and mobility: Secondary | ICD-10-CM | POA: Diagnosis not present

## 2024-02-20 DIAGNOSIS — M25552 Pain in left hip: Secondary | ICD-10-CM | POA: Diagnosis not present

## 2024-02-20 DIAGNOSIS — M25551 Pain in right hip: Secondary | ICD-10-CM | POA: Diagnosis not present

## 2024-03-05 DIAGNOSIS — Z9889 Other specified postprocedural states: Secondary | ICD-10-CM | POA: Diagnosis not present

## 2024-03-05 DIAGNOSIS — R29898 Other symptoms and signs involving the musculoskeletal system: Secondary | ICD-10-CM | POA: Diagnosis not present

## 2024-04-09 DIAGNOSIS — R6889 Other general symptoms and signs: Secondary | ICD-10-CM | POA: Diagnosis not present

## 2024-04-09 DIAGNOSIS — M25552 Pain in left hip: Secondary | ICD-10-CM | POA: Diagnosis not present

## 2024-04-09 DIAGNOSIS — M25551 Pain in right hip: Secondary | ICD-10-CM | POA: Diagnosis not present

## 2024-04-10 DIAGNOSIS — J342 Deviated nasal septum: Secondary | ICD-10-CM | POA: Diagnosis not present

## 2024-04-10 DIAGNOSIS — J343 Hypertrophy of nasal turbinates: Secondary | ICD-10-CM | POA: Diagnosis not present

## 2024-04-10 DIAGNOSIS — J3489 Other specified disorders of nose and nasal sinuses: Secondary | ICD-10-CM | POA: Diagnosis not present

## 2024-04-10 DIAGNOSIS — S022XXS Fracture of nasal bones, sequela: Secondary | ICD-10-CM | POA: Diagnosis not present

## 2024-04-16 DIAGNOSIS — L905 Scar conditions and fibrosis of skin: Secondary | ICD-10-CM | POA: Diagnosis not present

## 2024-04-16 DIAGNOSIS — Z85828 Personal history of other malignant neoplasm of skin: Secondary | ICD-10-CM | POA: Diagnosis not present

## 2024-04-16 DIAGNOSIS — D225 Melanocytic nevi of trunk: Secondary | ICD-10-CM | POA: Diagnosis not present

## 2024-05-01 DIAGNOSIS — M25551 Pain in right hip: Secondary | ICD-10-CM | POA: Diagnosis not present

## 2024-05-06 DIAGNOSIS — Z9889 Other specified postprocedural states: Secondary | ICD-10-CM | POA: Diagnosis not present

## 2024-05-06 DIAGNOSIS — Z6828 Body mass index (BMI) 28.0-28.9, adult: Secondary | ICD-10-CM | POA: Diagnosis not present

## 2024-05-06 DIAGNOSIS — M24851 Other specific joint derangements of right hip, not elsewhere classified: Secondary | ICD-10-CM | POA: Diagnosis not present

## 2024-05-07 DIAGNOSIS — Z6828 Body mass index (BMI) 28.0-28.9, adult: Secondary | ICD-10-CM | POA: Diagnosis not present

## 2024-05-07 DIAGNOSIS — R052 Subacute cough: Secondary | ICD-10-CM | POA: Diagnosis not present

## 2024-05-07 DIAGNOSIS — R0781 Pleurodynia: Secondary | ICD-10-CM | POA: Diagnosis not present

## 2024-05-07 DIAGNOSIS — J0110 Acute frontal sinusitis, unspecified: Secondary | ICD-10-CM | POA: Diagnosis not present

## 2024-05-07 DIAGNOSIS — M25551 Pain in right hip: Secondary | ICD-10-CM | POA: Diagnosis not present

## 2024-05-07 DIAGNOSIS — M25552 Pain in left hip: Secondary | ICD-10-CM | POA: Diagnosis not present

## 2024-05-15 DIAGNOSIS — S022XXS Fracture of nasal bones, sequela: Secondary | ICD-10-CM | POA: Diagnosis not present

## 2024-05-15 DIAGNOSIS — J3489 Other specified disorders of nose and nasal sinuses: Secondary | ICD-10-CM | POA: Diagnosis not present

## 2024-05-15 DIAGNOSIS — J342 Deviated nasal septum: Secondary | ICD-10-CM | POA: Diagnosis not present

## 2024-05-15 DIAGNOSIS — J343 Hypertrophy of nasal turbinates: Secondary | ICD-10-CM | POA: Diagnosis not present

## 2024-05-27 DIAGNOSIS — J348200 Internal nasal valve collapse, unspecified: Secondary | ICD-10-CM | POA: Diagnosis not present

## 2024-05-27 DIAGNOSIS — J342 Deviated nasal septum: Secondary | ICD-10-CM | POA: Diagnosis not present

## 2024-05-27 DIAGNOSIS — M950 Acquired deformity of nose: Secondary | ICD-10-CM | POA: Diagnosis not present

## 2024-05-27 DIAGNOSIS — J3489 Other specified disorders of nose and nasal sinuses: Secondary | ICD-10-CM | POA: Diagnosis not present

## 2024-05-27 DIAGNOSIS — J343 Hypertrophy of nasal turbinates: Secondary | ICD-10-CM | POA: Diagnosis not present

## 2024-07-17 DIAGNOSIS — S73191A Other sprain of right hip, initial encounter: Secondary | ICD-10-CM | POA: Diagnosis not present

## 2024-07-17 DIAGNOSIS — M25851 Other specified joint disorders, right hip: Secondary | ICD-10-CM | POA: Diagnosis not present

## 2024-08-03 DIAGNOSIS — R6889 Other general symptoms and signs: Secondary | ICD-10-CM | POA: Diagnosis not present

## 2024-08-03 DIAGNOSIS — M25551 Pain in right hip: Secondary | ICD-10-CM | POA: Diagnosis not present

## 2024-08-03 DIAGNOSIS — Z9889 Other specified postprocedural states: Secondary | ICD-10-CM | POA: Diagnosis not present

## 2024-08-03 DIAGNOSIS — R29898 Other symptoms and signs involving the musculoskeletal system: Secondary | ICD-10-CM | POA: Diagnosis not present

## 2024-08-10 DIAGNOSIS — Z9889 Other specified postprocedural states: Secondary | ICD-10-CM | POA: Diagnosis not present

## 2024-08-10 DIAGNOSIS — R29898 Other symptoms and signs involving the musculoskeletal system: Secondary | ICD-10-CM | POA: Diagnosis not present

## 2024-08-10 DIAGNOSIS — M25551 Pain in right hip: Secondary | ICD-10-CM | POA: Diagnosis not present

## 2024-08-10 DIAGNOSIS — R6889 Other general symptoms and signs: Secondary | ICD-10-CM | POA: Diagnosis not present

## 2024-08-12 DIAGNOSIS — M25551 Pain in right hip: Secondary | ICD-10-CM | POA: Diagnosis not present

## 2024-08-12 DIAGNOSIS — Z6829 Body mass index (BMI) 29.0-29.9, adult: Secondary | ICD-10-CM | POA: Diagnosis not present

## 2024-08-17 DIAGNOSIS — M25551 Pain in right hip: Secondary | ICD-10-CM | POA: Diagnosis not present

## 2024-08-17 DIAGNOSIS — R29898 Other symptoms and signs involving the musculoskeletal system: Secondary | ICD-10-CM | POA: Diagnosis not present

## 2024-08-17 DIAGNOSIS — R6889 Other general symptoms and signs: Secondary | ICD-10-CM | POA: Diagnosis not present

## 2024-08-17 DIAGNOSIS — Z9889 Other specified postprocedural states: Secondary | ICD-10-CM | POA: Diagnosis not present

## 2024-08-24 DIAGNOSIS — R6889 Other general symptoms and signs: Secondary | ICD-10-CM | POA: Diagnosis not present

## 2024-08-24 DIAGNOSIS — R29898 Other symptoms and signs involving the musculoskeletal system: Secondary | ICD-10-CM | POA: Diagnosis not present

## 2024-08-24 DIAGNOSIS — M25551 Pain in right hip: Secondary | ICD-10-CM | POA: Diagnosis not present

## 2024-08-24 DIAGNOSIS — Z9889 Other specified postprocedural states: Secondary | ICD-10-CM | POA: Diagnosis not present
